# Patient Record
Sex: Male | Born: 1995 | Race: White | Hispanic: No | Marital: Single | Smoking: Never smoker
Health system: Southern US, Community
[De-identification: ages and names within clinical notes are randomized; demographics above are authoritative.]

## PROBLEM LIST (undated history)

## (undated) DIAGNOSIS — S42009A Fracture of unspecified part of unspecified clavicle, initial encounter for closed fracture: Secondary | ICD-10-CM

## (undated) DIAGNOSIS — M549 Dorsalgia, unspecified: Secondary | ICD-10-CM

---
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
## 2015-05-04 ENCOUNTER — Ambulatory Visit: Payer: Self-pay | Admitting: Primary Care

## 2015-07-15 ENCOUNTER — Other Ambulatory Visit: Payer: Self-pay | Admitting: Physical Medicine and Rehabilitation

## 2015-07-15 DIAGNOSIS — M545 Low back pain: Secondary | ICD-10-CM

## 2015-07-26 ENCOUNTER — Other Ambulatory Visit: Payer: Self-pay | Admitting: Physical Medicine and Rehabilitation

## 2015-07-26 DIAGNOSIS — M545 Low back pain: Secondary | ICD-10-CM

## 2015-07-27 ENCOUNTER — Ambulatory Visit
Admission: RE | Admit: 2015-07-27 | Discharge: 2015-07-27 | Disposition: A | Discharge status: Home / Self Care | Payer: BLUE CROSS/BLUE SHIELD | Source: Ambulatory Visit | Attending: Physical Medicine and Rehabilitation | Admitting: Physical Medicine and Rehabilitation

## 2015-07-27 DIAGNOSIS — M545 Low back pain: Secondary | ICD-10-CM

## 2015-08-28 ENCOUNTER — Encounter: Payer: Self-pay | Admitting: Emergency Medicine

## 2015-08-28 ENCOUNTER — Ambulatory Visit
Admission: EM | Admit: 2015-08-28 | Discharge: 2015-08-28 | Disposition: A | Discharge status: Home / Self Care | Payer: BLUE CROSS/BLUE SHIELD | Attending: Internal Medicine | Admitting: Internal Medicine

## 2015-08-28 DIAGNOSIS — H538 Other visual disturbances: Secondary | ICD-10-CM | POA: Diagnosis not present

## 2015-08-28 DIAGNOSIS — R0981 Nasal congestion: Secondary | ICD-10-CM

## 2015-08-28 HISTORY — DX: Dorsalgia, unspecified: M54.9

## 2015-08-28 HISTORY — DX: Fracture of unspecified part of unspecified clavicle, initial encounter for closed fracture: S42.009A

## 2015-08-28 MED ORDER — HYPROMELLOSE (GONIOSCOPIC) 2.5 % OP SOLN: 2.0000 [drp] | Freq: Four times a day (QID) | OPHTHALMIC | Status: AC | PRN

## 2015-08-28 MED ORDER — FLUORESCEIN SODIUM 1 MG OP STRP
1.0000 | ORAL_STRIP | Freq: Once | OPHTHALMIC | Status: AC
  Administered 2015-08-28: 1 via OPHTHALMIC

## 2015-08-28 MED ORDER — TETRACAINE HCL 0.5 % OP SOLN
1.0000 [drp] | Freq: Once | OPHTHALMIC | Status: AC
  Administered 2015-08-28: 1 [drp] via OPHTHALMIC

## 2015-08-28 NOTE — ED Notes (Signed)
Back problems for 6 months, getting steroid shots, herniated disc, last shot last Monday. Prescribed topiramate.  Last night a little blurred vision and SOB, and now vision so blurred can't read or drive.  Concerned reaction to his medications.

## 2015-08-28 NOTE — ED Provider Notes (Addendum)
CSN: [id number]     Arrival date & time 08/28/15  [id number] History   None    Chief Complaint  Patient presents with  . Blurred Vision  . Shortness of Breath   HPI  Patient is a 19 year old gentleman has been having some difficulty with back pain. He started on Topamax 25 mg daily on November 9. Last night, he took 50 mg, as a dose escalation. He had a little bit of blurry vision bilaterally last evening, this morning is having a lot of burning eye discomfort, wateriness, and a marked increase in blurring vision. Slightly short of breath, but not coughing, no runny nose, not aware of head congestion. No fever. Neurologically intact, was able to walk into the urgent care independently, moving all extremities, making sense when he speaks, coherent clear speech. 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 Has a follow-up with his back doctor Dr. Barbette MerinoShawn Bethea, on Monday, November 21.  Past Medical History  Diagnosis Date  . Back pain   . Collar bone fracture     Right side x2   History reviewed. No pertinent past surgical history. Family History  Problem Relation Age of Onset  . Diabetes Paternal Grandmother   . Depression Paternal Grandfather   . Hypertension Paternal Grandfather   . Heart attack Paternal Grandfather    Social History  Substance Use Topics  . Smoking status: Never Smoker   . Smokeless tobacco: None  . Alcohol Use: No    Review of Systems  All other systems reviewed and are negative.   Allergies  Review of patient's allergies indicates no known allergies.  Home Medications  Just started taking topamax for back pain   BP 145/82 mmHg  Pulse 87  Temp(Src) 98.1 F (36.7 C) (Oral)  Resp 18  Ht 5\' 7"  (1.702 m)  Wt 151 lb (68.493 kg)  BMI 23.64 kg/m2  SpO2 100%   Physical Exam  Constitutional: He is oriented to person, place, and time. No distress.  Alert, nicely groomed  HENT:  Head:  Atraumatic.  HEENT notes inadvertently documented in the eye section  Eyes:  Conjugate gaze, makes eye contact. Bilateral upper and lower lids are slightly swollen, slightly erythematous. Conjunctiva bilaterally is mildly injected. Pupils are equal and round, react somewhat sluggishly to light, but they do react and are symmetric. Photophobia. Mild chemosis of the lower conjunctivae bilaterally. Tearing observed, and some scant mucousy discharge in both eyes. Bilateral TMs are moderately dull, no erythema. Moderate nasal congestion on the left, marked nasal congestion on the right. Posterior pharynx is red.  Neck: Neck supple.  Cardiovascular: Normal rate and regular rhythm.   Pulmonary/Chest: No respiratory distress. He has no wheezes. He has no rales.  Lungs clear, symmetric breath sounds Good chest excursion, no respiratory distress at the time of this exam, quiet unlabored respirations  Abdominal: Soft. He exhibits no distension.  Musculoskeletal: Normal range of motion.  Neurological: He is alert and oriented to person, place, and time.  Walked into the urgent care independently, able to climb onto the exam table, face is symmetric, speech is clear/coherent  Skin: Skin is warm and dry.  No cyanosis Pink  Nursing note and vitals reviewed.   ED Course  Procedures    Visual Acuity Review  Right Eye Distance:  (unable to identify letter or picture, just a blurr) Left Eye Distance:  (unable to  identify letter or picture, just blurr ) Bilateral Distance:  (not able to see letter or picture, just blurr; usual vision 20/20 no glasses )  Right Eye Near:   Left Eye Near:    Bilateral Near:   (can read large type on admission form if about 6 inches from face )      MDM   1. Blurred vision, bilateral   2. Sinus congestion    Possible medication side effect, ingestion is also on the differential diagnosis. Impending upper respiratory infection with mucousy discharge in the eyes  is another possibility. Cool compresses may be soothing. Artificial tears may also be helpful. Patient is to discontinue Topamax, and follow-up with his back doctor to discuss other options. This is currently scheduled for Monday 11/21. Anticipate improvement in vision in the next 24-48 hours; if this does not occur patient should see the eye doctor. Note for work, with return to work of 11/21, if needed.  Eustace Moore, MD 08/28/15 1010  Eustace Moore, MD 08/29/15 2017

## 2015-08-28 NOTE — Discharge Instructions (Signed)
Blurred vision today may be coming from a medication reaction (topiramate?) or  possibly sinus discharge/early sinus infection.   Stop taking topamax.   Have eyes checked by eye doctor if not starting to improve in next 24-48 hours.   Cool compresses may help with eye discomfort.  Blurred Vision Having blurred vision means that you cannot see things clearly. Your vision may seem fuzzy or out of focus. Blurred vision is a very common symptom of an eye or vision problem. Blurred vision is often a gradual blur that occurs in one eye or both eyes. There are many causes of blurred vision, including cataracts, macular degeneration, and diabetic retinopathy. Blurred vision can be diagnosed based on your symptoms and a physical exam. Tell your health care provider about any other health problems you have, any recent eye injury, and any prior surgeries. You may need to see a health care provider who specializes in eye problems (ophthalmologist). Your treatment depends on what is causing your blurred vision.  HOME CARE INSTRUCTIONS  Tell your health care provider about any changes in your blurred vision.  Do not drive or operate heavy machinery if your vision is blurry.  Keep all follow-up visits as directed by your health care provider. This is important. SEEK MEDICAL CARE IF:  Your symptoms get worse.  You have new symptoms.  You have trouble seeing at night.  You have trouble seeing up close or far away.  You have trouble noticing the difference between colors. SEEK IMMEDIATE MEDICAL CARE IF:  You have severe eye pain.  You have a severe headache.  You have flashing lights in your field of vision.  You have a sudden change in vision.  You have a sudden loss of vision.  You have vision change after an injury.  You notice drainage coming from your eyes.  You notice a rash around your eyes.   This information is not intended to replace advice given to you by your health care  provider. Make sure you discuss any questions you have with your health care provider.   Document Released: 09/28/2003 Document Revised: 02/09/2015 Document Reviewed: 08/19/2014 Elsevier Interactive Patient Education [employer]2016 [employer]vier Inc.

## 2015-10-15 ENCOUNTER — Encounter: Payer: Self-pay | Admitting: Emergency Medicine

## 2015-10-15 ENCOUNTER — Emergency Department
Admission: EM | Admit: 2015-10-15 | Discharge: 2015-10-15 | Disposition: A | Discharge status: Home / Self Care | Payer: BLUE CROSS/BLUE SHIELD | Attending: Student | Admitting: Student

## 2015-10-15 ENCOUNTER — Emergency Department: Payer: BLUE CROSS/BLUE SHIELD

## 2015-10-15 DIAGNOSIS — R109 Unspecified abdominal pain: Secondary | ICD-10-CM

## 2015-10-15 DIAGNOSIS — R000 Tachycardia, unspecified: Secondary | ICD-10-CM | POA: Insufficient documentation

## 2015-10-15 DIAGNOSIS — N200 Calculus of kidney: Secondary | ICD-10-CM | POA: Diagnosis not present

## 2015-10-15 DIAGNOSIS — R1031 Right lower quadrant pain: Secondary | ICD-10-CM | POA: Diagnosis present

## 2015-10-15 LAB — CBC WITH DIFFERENTIAL/PLATELET
BASOS ABS: 0.1 10*3/uL (ref 0–0.1)
Basophils Relative: 0 %
EOS ABS: 0.1 10*3/uL (ref 0–0.7)
Eosinophils Relative: 0 %
HCT: 45.3 % (ref 40.0–52.0)
HEMOGLOBIN: 15.3 g/dL (ref 13.0–18.0)
LYMPHS ABS: 1.4 10*3/uL (ref 1.0–3.6)
Lymphocytes Relative: 10 %
MCH: 31.3 pg (ref 26.0–34.0)
MCHC: 33.8 g/dL (ref 32.0–36.0)
MCV: 92.5 fL (ref 80.0–100.0)
Monocytes Absolute: 1 10*3/uL (ref 0.2–1.0)
Monocytes Relative: 7 %
NEUTROS PCT: 83 %
Neutro Abs: 12.4 10*3/uL — ABNORMAL HIGH (ref 1.4–6.5)
Platelets: 192 10*3/uL (ref 150–440)
RBC: 4.89 MIL/uL (ref 4.40–5.90)
RDW: 12.7 % (ref 11.5–14.5)
WBC: 14.9 10*3/uL — AB (ref 3.8–10.6)

## 2015-10-15 LAB — URINALYSIS COMPLETE WITH MICROSCOPIC (ARMC ONLY)
BACTERIA UA: NONE SEEN
Bilirubin Urine: NEGATIVE
Glucose, UA: NEGATIVE mg/dL
Hgb urine dipstick: NEGATIVE
Leukocytes, UA: NEGATIVE
Nitrite: NEGATIVE
PROTEIN: 30 mg/dL — AB
RBC / HPF: NONE SEEN RBC/hpf (ref 0–5)
Specific Gravity, Urine: 1.028 (ref 1.005–1.030)
pH: 5 (ref 5.0–8.0)

## 2015-10-15 LAB — COMPREHENSIVE METABOLIC PANEL
ALK PHOS: 60 U/L (ref 38–126)
ALT: 18 U/L (ref 17–63)
AST: 20 U/L (ref 15–41)
Albumin: 5.3 g/dL — ABNORMAL HIGH (ref 3.5–5.0)
Anion gap: 10 (ref 5–15)
BILIRUBIN TOTAL: 1 mg/dL (ref 0.3–1.2)
BUN: 18 mg/dL (ref 6–20)
CALCIUM: 10 mg/dL (ref 8.9–10.3)
CO2: 26 mmol/L (ref 22–32)
CREATININE: 0.89 mg/dL (ref 0.61–1.24)
Chloride: 104 mmol/L (ref 101–111)
GFR calc non Af Amer: >60 mL/min (ref >60)
Glucose, Bld: 132 mg/dL — ABNORMAL HIGH (ref 65–99)
Potassium: 4.2 mmol/L (ref 3.5–5.1)
SODIUM: 140 mmol/L (ref 135–145)
Total Protein: 7.9 g/dL (ref 6.5–8.1)

## 2015-10-15 LAB — LIPASE, BLOOD: Lipase: 22 U/L (ref 11–51)

## 2015-10-15 MED ORDER — HYDROMORPHONE HCL 1 MG/ML IJ SOLN
1.0000 mg | Freq: Once | INTRAMUSCULAR | Status: AC
  Administered 2015-10-15: 1 mg via INTRAVENOUS
  Filled 2015-10-15: qty 1

## 2015-10-15 MED ORDER — KETOROLAC TROMETHAMINE 30 MG/ML IJ SOLN
INTRAMUSCULAR | Status: AC
  Administered 2015-10-15: 30 mg via INTRAVENOUS
  Filled 2015-10-15: qty 1

## 2015-10-15 MED ORDER — SODIUM CHLORIDE 0.9 % IV BOLUS (SEPSIS)
1000.0000 mL | Freq: Once | INTRAVENOUS | Status: AC
  Administered 2015-10-15: 1000 mL via INTRAVENOUS

## 2015-10-15 MED ORDER — OXYCODONE HCL 5 MG PO TABS: 5.0000 mg | ORAL_TABLET | Freq: Four times a day (QID) | ORAL | Status: AC | PRN

## 2015-10-15 MED ORDER — TAMSULOSIN HCL 0.4 MG PO CAPS: 0.4000 mg | ORAL_CAPSULE | Freq: Every day | ORAL | Status: AC

## 2015-10-15 MED ORDER — KETOROLAC TROMETHAMINE 30 MG/ML IJ SOLN
30.0000 mg | Freq: Once | INTRAMUSCULAR | Status: AC
Start: 2015-10-15 — End: 2015-10-15
  Administered 2015-10-15: 30 mg via INTRAVENOUS

## 2015-10-15 MED ORDER — ONDANSETRON 4 MG PO TBDP: 4.0000 mg | ORAL_TABLET | Freq: Three times a day (TID) | ORAL | Status: AC | PRN

## 2015-10-15 MED ORDER — ONDANSETRON HCL 4 MG/2ML IJ SOLN
4.0000 mg | Freq: Once | INTRAMUSCULAR | Status: AC
  Administered 2015-10-15: 4 mg via INTRAVENOUS
  Filled 2015-10-15: qty 2

## 2015-10-15 MED ORDER — MORPHINE SULFATE (PF) 4 MG/ML IV SOLN
4.0000 mg | Freq: Once | INTRAVENOUS | Status: AC
  Administered 2015-10-15: 4 mg via INTRAVENOUS
  Filled 2015-10-15: qty 1

## 2015-10-15 NOTE — ED Notes (Signed)
AAOx3.  Skin warm and dry. NAD.  Ambulates with easy and steady gait.   

## 2015-10-15 NOTE — ED Notes (Signed)
States he developed right flank and abd pain this am   Pos n/v

## 2015-10-15 NOTE — ED Provider Notes (Signed)
[facility] Emergency Department Provider Note  ____________________________________________  Time seen: Approximately 10:57 AM  I have reviewed the triage vital signs and the nursing notes.   HISTORY  Chief Complaint Flank Pain    HPI Hulan Saasustin Lukin is a 20 y.o. male with history of chronic back pain who presents for evaluation of sudden onset right flank pain radiating into the right lower abdomen and into the suprapubic region that began this morning, constant since onset, severe, no modifying factors. He is also had 2 episodes of nonbloody nonbilious emesis. No diarrhea but did have a bowel movement this morning. He denies any pain or burning with urination or hematuria. No chest pain or difficulty breathing. No History of kidney stones.   Past Medical History  Diagnosis Date  . Back pain   . Collar bone fracture     Right side x2    Patient Active Problem List   Diagnosis Date Noted  . Hematemesis 10/24/2011  . Epigastric abdominal pain 10/24/2011    History reviewed. No pertinent past surgical history.  Current Outpatient Rx  Name  Route  Sig  Dispense  Refill  . ibuprofen (ADVIL,MOTRIN) 200 MG tablet   Oral   Take 400 mg by mouth every 6 (six) hours as needed.          . hydroxypropyl methylcellulose / hypromellose (ISOPTO TEARS / GONIOVISC) 2.5 % ophthalmic solution   Both Eyes   Place 2 drops into both eyes 4 (four) times daily as needed for dry eyes. Patient not taking: Reported on 10/15/2015   15 mL   0     Allergies Topamax  Family History  Problem Relation Age of Onset  . Diabetes Paternal Grandmother   . Depression Paternal Grandfather   . Hypertension Paternal Grandfather   . Heart attack Paternal Grandfather     Social History Social History  Substance Use Topics  . Smoking status: Never Smoker   . Smokeless tobacco: None  . Alcohol Use: No    Review of Systems Constitutional: No fever/chills Eyes: No visual  changes. ENT: No sore throat. Cardiovascular: Denies chest pain. Respiratory: Denies shortness of breath. Gastrointestinal: + abdominal pain.  + nausea, + vomiting.  No diarrhea.  No constipation. Genitourinary: Negative for dysuria. Musculoskeletal: Positive for right flank pain. Skin: Negative for rash. Neurological: Negative for headaches, focal weakness or numbness.  10-point ROS otherwise negative.  ____________________________________________   PHYSICAL EXAM:  VITAL SIGNS: ED Triage Vitals  Enc Vitals Group     BP 10/15/15 0958 134/108 mmHg     Pulse Rate 10/15/15 0958 118     Resp 10/15/15 0958 20     Temp 10/15/15 0958 97.5 F (36.4 C)     Temp src --      SpO2 10/15/15 0958 98 %     Weight 10/15/15 0958 150 lb (68.04 kg)     Height 10/15/15 0958 5\' 7"  (1.702 m)     Head Cir --      Peak Flow --      Pain Score 10/15/15 0958 10     Pain Loc --      Pain Edu? --      Excl. in GC? --     Constitutional: Alert and oriented. In distress due to pain. Mildly diaphoretic. Eyes: Conjunctivae are normal. PERRL. EOMI. Head: Atraumatic. Nose: No congestion/rhinnorhea. Mouth/Throat: Mucous membranes are moist.  Oropharynx non-erythematous. Neck: No stridor.  Cardiovascular: Tachycardic rate, regular rhythm. Grossly normal heart  sounds.  Good peripheral circulation. Respiratory: Normal respiratory effort.  No retractions. Lungs CTAB. Gastrointestinal: Soft mild suprapubic and right-sided tenderness, no rebound or guarding. + right CVA tenderness. Genitourinary: Deferred Musculoskeletal: No lower extremity tenderness nor edema.  No joint effusions. Neurologic:  Normal speech and language. No gross focal neurologic deficits are appreciated.  Skin:  Skin is warm, dry and intact. No rash noted. Psychiatric: Mood and affect are normal. Speech and behavior are normal.  ____________________________________________   LABS (all labs ordered are listed, but only abnormal  results are displayed)  Labs Reviewed  CBC WITH DIFFERENTIAL/PLATELET - Abnormal; Notable for the following:    WBC 14.9 (*)    Neutro Abs 12.4 (*)    All other components within normal limits  COMPREHENSIVE METABOLIC PANEL - Abnormal; Notable for the following:    Glucose, Bld 132 (*)    Albumin 5.3 (*)    All other components within normal limits  URINALYSIS COMPLETEWITH MICROSCOPIC ([facility] ONLY) - Abnormal; Notable for the following:    Color, Urine YELLOW (*)    APPearance HAZY (*)    Ketones, ur 1+ (*)    Protein, ur 30 (*)    Squamous Epithelial / LPF 0-5 (*)    All other components within normal limits  LIPASE, BLOOD   ____________________________________________  EKG  none ____________________________________________  RADIOLOGY  CT abdomen and pelvis  IMPRESSION: There is a 4 mm stone within the distal right ureter resulting in mild right hydroureteronephrosis.  Calcifications within the renal medulla bilaterally raising the possibility of medullary nephrocalcinosis.  ____________________________________________   PROCEDURES  Procedure(s) performed: None  Critical Care performed: No  ____________________________________________   INITIAL IMPRESSION / ASSESSMENT AND PLAN / ED COURSE  Pertinent labs & imaging results that were available during my care of the patient were reviewed by me and considered in my medical decision making (see chart for details).  Berel Najjar is a 20 y.o. male with history of chronic back pain who presents for evaluation of sudden onset right flank pain radiating into the right lower abdomen and into the suprapubic region that began this morning. On exam, he is in distress due to pain. Vital signs notable for tachycardia, he is afebrile with stable blood pressure. He does have right CVA tenderness and mouth right-sided abdominal tenderness. Clinical picture is concerning for nephrolithiasis/renal colic. Awaiting labs, urinalysis  as well as CT of the abdomen and pelvis. We'll treat his pain, give IV fluids, reassess for disposition.  ----------------------------------------- 1:31 PM on 10/15/2015 -----------------------------------------  Labs reviewed. It is notable for leukocytosis of 14.9. Normal CMP. Normal lipase. Urinalysis negative for infection. CT of the abdomen and pelvis shows a 4 mm right distal ureteral stone with mild right hydroureteronephrosis. At this time, the patient's pain is well-controlled. He is tolerating by mouth intake. He was slightly bradycardic however heart rate is now 71 bpm. He is maintaining adequate blood pressure. We discussed return precautions, need for close urology follow-up, he is comfortable with the discharge plan. DC with expectant management. ____________________________________________   FINAL CLINICAL IMPRESSION(S) / ED DIAGNOSES  Final diagnoses:  Flank pain  Right nephrolithiasis      Gayla Doss, MD 10/15/15 1332

## 2017-08-09 IMAGING — MR MR LUMBAR SPINE W/O CM
5 series · 43 of 48 positions shown · non-contrast
Comparison: Lumbar spine plain films are reported separately.

CLINICAL DATA: No known injury. Back pain for 6-7 months. RIGHT
groin pain. Unresponsive to steroids.

EXAM:
MRI LUMBAR SPINE WITHOUT CONTRAST
TECHNIQUE: Multiplanar, multisequence MR imaging of the lumbar spine was
performed. No intravenous contrast was administered.

[Series 3: tirm sag · sagittal · 4.0mm · 0.55mm/px · 7 of 14 slices shown]
[im 1/14]
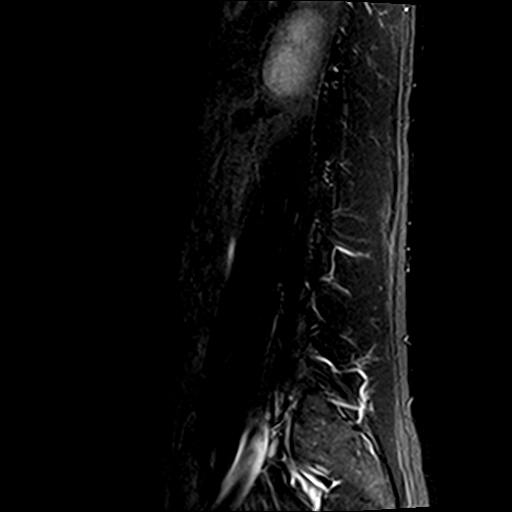
[im 3/14]
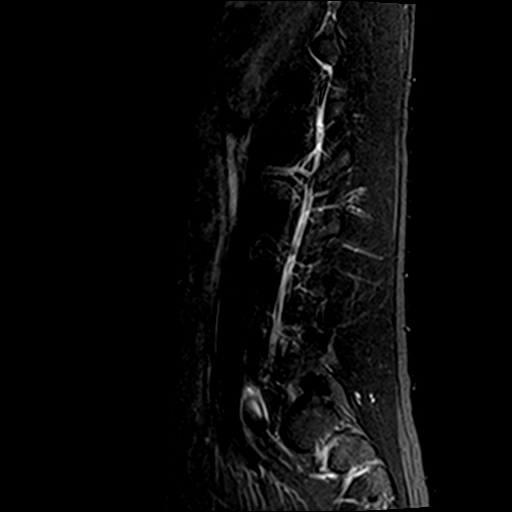
[im 5/14]
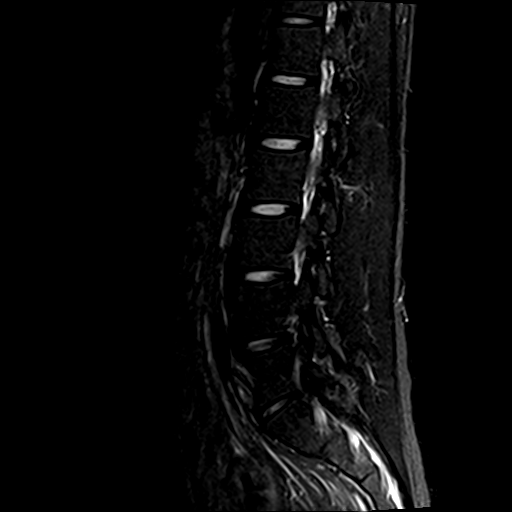
[im 7/14]
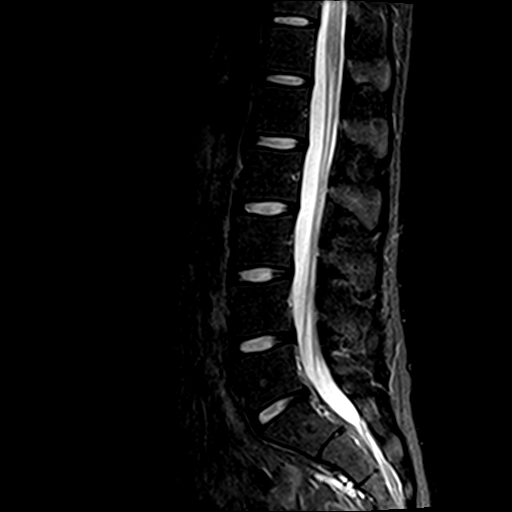
[im 9/14]
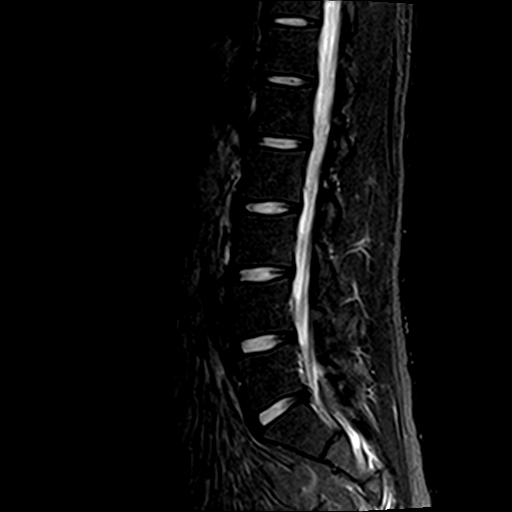
[im 11/14]
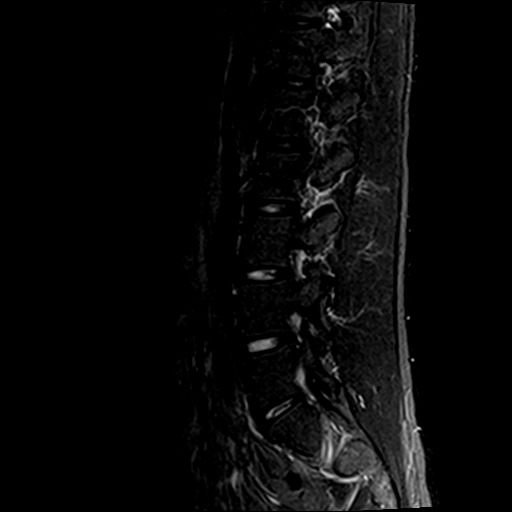
[im 14/14]
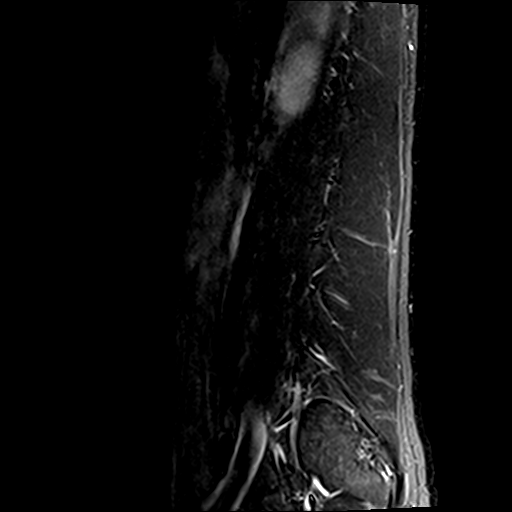

[Series 4: T2 · sagittal · 4.0mm · 0.88mm/px · 7 of 14 slices shown (1 of 2)]
[im 1/14]
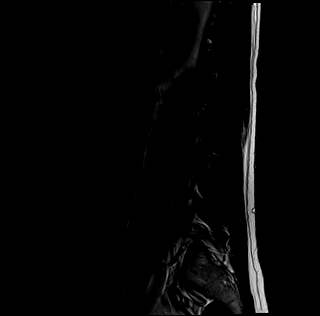
[im 3/14]
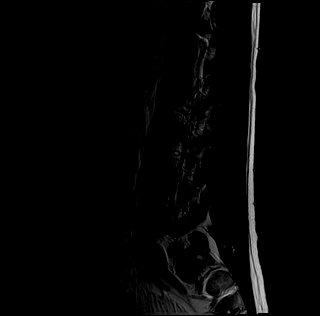
[im 5/14]
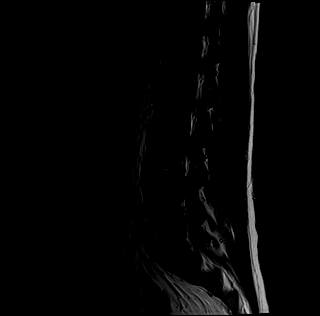
[im 7/14]
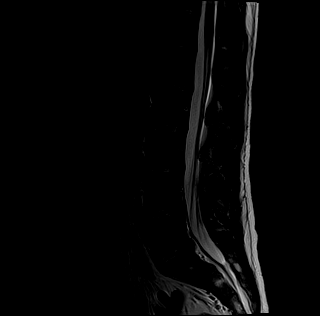
[im 9/14]
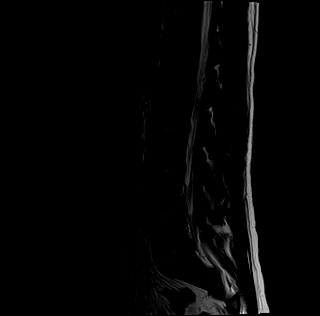
[im 11/14]
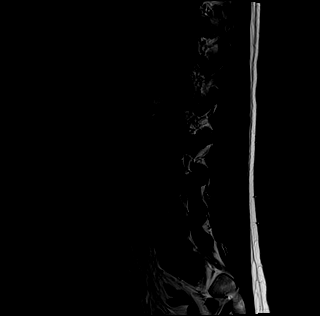
[im 14/14]
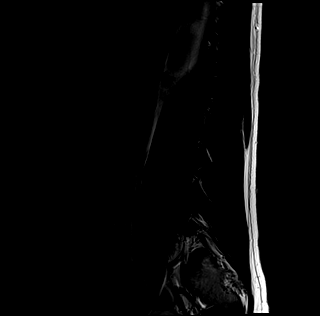

[Series 5: T1 · sagittal · 4.0mm · 0.88mm/px · 6 of 14 slices shown (1 of 2)]
[im 1/14]
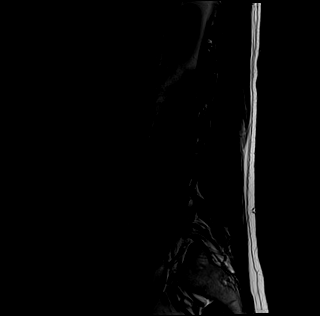
[im 3/14]
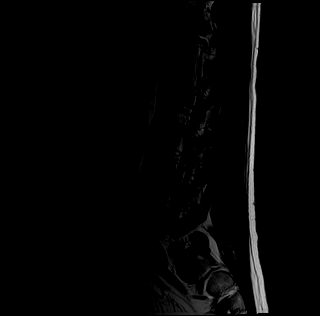
[im 6/14]
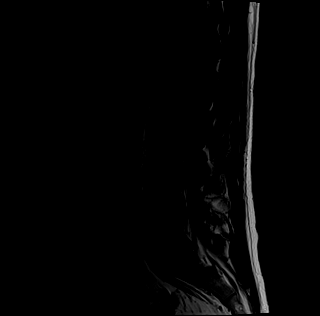
[im 8/14]
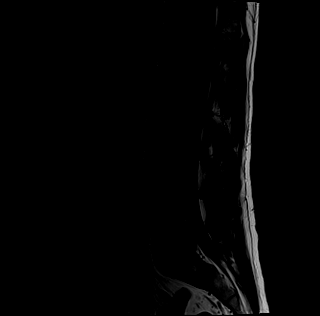
[im 11/14]
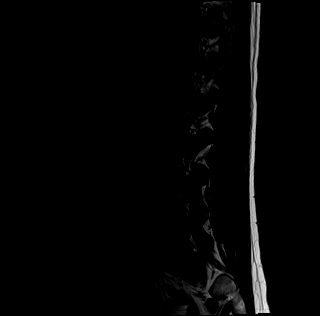
[im 14/14]
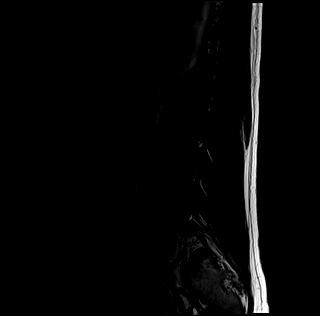

[Series 6: T1 · axial · 4.0mm · 0.70mm/px · z∈[-53,+132]mm · 9 of 32 slices shown (2 of 2)]
[im 1/32]
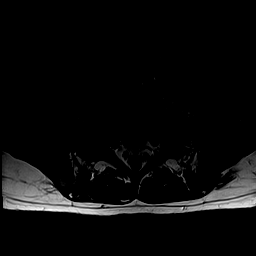
[im 3/32]
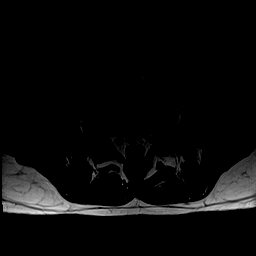
[im 5/32]
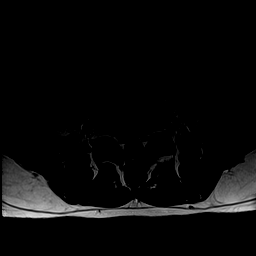
[im 10/32]
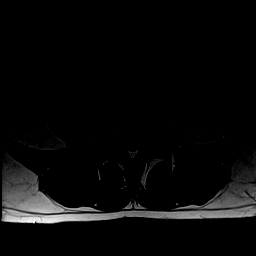
[im 15/32]
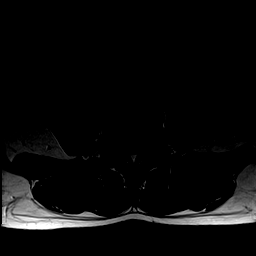
[im 17/32]
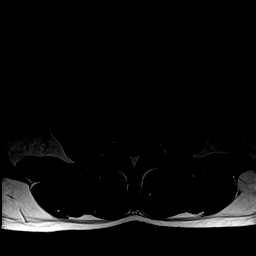
[im 22/32]
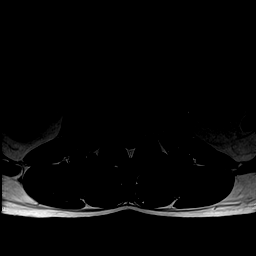
[im 27/32]
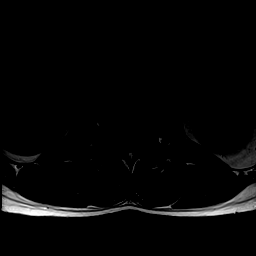
[im 32/32]
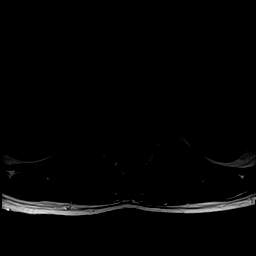

[Series 7: T2 · axial · 4.0mm · 0.70mm/px · z∈[-53,+132]mm · 14 of 32 slices shown (2 of 2)]
[im 1/32]
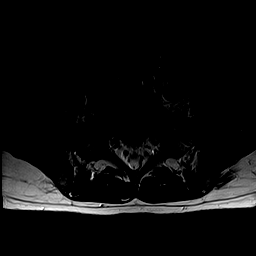
[im 3/32]
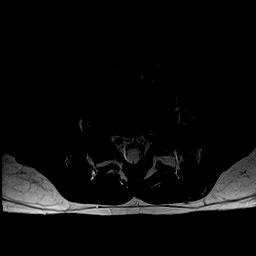
[im 5/32]
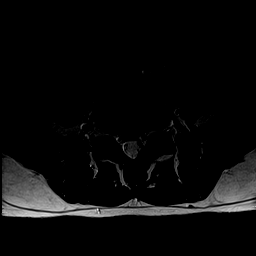
[im 8/32]
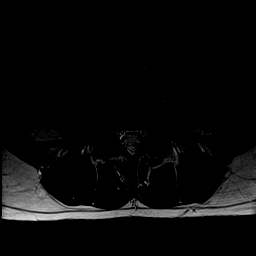
[im 10/32]
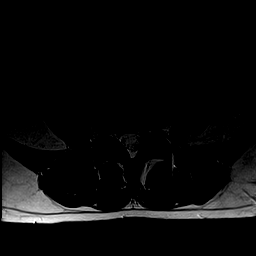
[im 12/32]
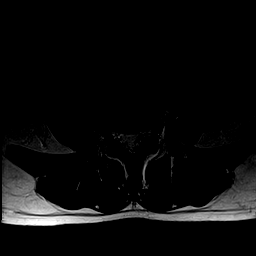
[im 15/32]
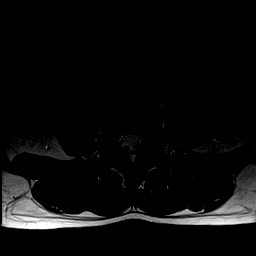
[im 17/32]
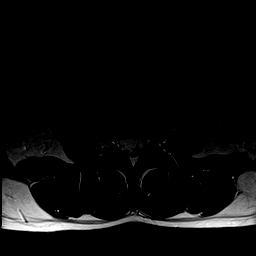
[im 20/32]
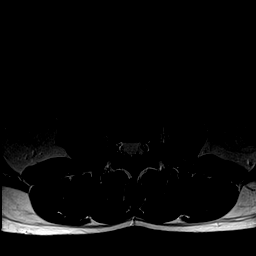
[im 22/32]
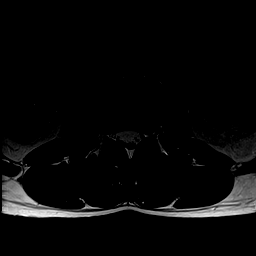
[im 24/32]
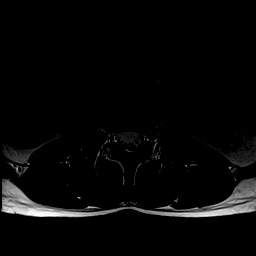
[im 27/32]
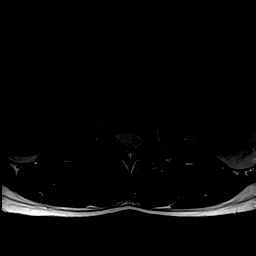
[im 29/32]
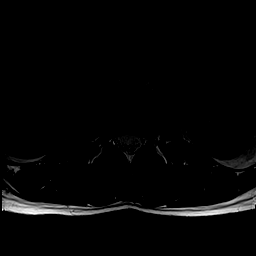
[im 32/32]
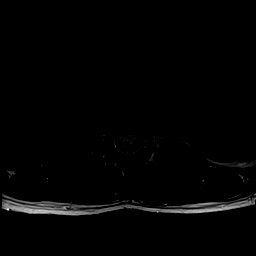

[43 of 48 positions shown; findings below may reference images not displayed]

FINDINGS: Segmentation: Normal.

Alignment:  Normal.

Vertebrae: No worrisome osseous lesion.

Conus medullaris: Normal in size, signal, and location.

Paraspinal tissues: No evidence for hydronephrosis or paravertebral
mass.

Disc levels:

L1-L2:  Normal.

L2-L3:  Normal.

L3-L4:  Normal.

L4-L5:  Normal.

L5-S1: Central and rightward protrusion. This contacts, and slightly
displaces, the RIGHT S1 nerve root. No significant foraminal
narrowing. No significant spinal stenosis.
IMPRESSION: Central and rightward protrusion L5-S1. Mild mass effect on the
RIGHT S1 nerve root.

## 2024-02-28 ENCOUNTER — Ambulatory Visit
Admission: RE | Admit: 2024-02-28 | Discharge: 2024-02-28 | Disposition: A | Discharge status: Home / Self Care | Source: Ambulatory Visit | Attending: Family Medicine | Admitting: Family Medicine

## 2024-02-28 VITALS — BP 144/90 | HR 74 | Temp 99.1°F | Resp 18

## 2024-02-28 DIAGNOSIS — K920 Hematemesis: Secondary | ICD-10-CM | POA: Diagnosis not present

## 2024-02-28 DIAGNOSIS — K921 Melena: Secondary | ICD-10-CM

## 2024-02-28 DIAGNOSIS — F1021 Alcohol dependence, in remission: Secondary | ICD-10-CM | POA: Diagnosis not present

## 2024-02-28 NOTE — ED Triage Notes (Signed)
 Pt reports blood in stool today, Monday vomited up blood.

## 2024-02-28 NOTE — Discharge Instructions (Addendum)
 It is important to establish care as soon as possible with a primary care provider to help coordinate your care and follow-up routinely with you.  You may go to the [facility] health website and click on the find a provider tab.  You may then find a primary care provider and set up an establish care appointment on the website.  I have placed a referral in case this goes through but it may likely need to go through a primary care provider to be excepted.  As we discussed at length, go to the emergency department for any worsening symptoms at any time.

## 2024-02-28 NOTE — ED Provider Notes (Addendum)
 [facility]    CSN: [id number] Arrival date & time: 02/28/24  7829      History   Chief Complaint Chief Complaint  Patient presents with   Abdominal Pain    Have puked up blood Monday and this morning had alot of blood in stool - Entered by patient    HPI Lawrence Gillespie is a 28 y.o. male.   Patient presenting today complaining of several episodes of hematemesis of bright red blood occurring 4 days ago, and then 3 episodes of bright red blood with bowel movements occurring this morning.  Denies abdominal pain though from time to time has some mild aching in the right upper quadrant and denies fever, chills, dizziness, weakness, chest pain, shortness of breath, new foods or medications.  Per chart review has had hematemesis in the past about 10 years ago.  He states he has been a heavy drinker for about 10 years now, drinking about 7 to 10 cans of twisted tea nightly but stopped Monday.  He also obtained his own metabolic panel at [facility] labs about a month ago and notes that his AST and ALT were about double the normal limit.  Does not currently have a primary care provider.  Called a GI specialist and was told he needed a referral and they directed him to urgent care.    Past Medical History:  Diagnosis Date   Back pain    Collar bone fracture    Right side x2    Patient Active Problem List   Diagnosis Date Noted   Hematemesis 10/24/2011   Epigastric abdominal pain 10/24/2011    History reviewed. No pertinent surgical history.     Home Medications    Prior to Admission medications   Medication Sig Start Date End Date Taking? Authorizing Provider  hydroxypropyl methylcellulose / hypromellose (ISOPTO TEARS / GONIOVISC) 2.5 % ophthalmic solution Place 2 drops into both eyes 4 (four) times daily as needed for dry eyes. Patient not taking: Reported on 10/15/2015 08/28/15   Abbey Hobby, MD  ibuprofen (ADVIL,MOTRIN) 200 MG tablet Take 400 mg by mouth every 6  (six) hours as needed.     [provider]  ondansetron  (ZOFRAN  ODT) 4 MG disintegrating tablet Take 1 tablet (4 mg total) by mouth every 8 (eight) hours as needed for nausea or vomiting. 10/15/15   Mancil Seat, MD  oxyCODONE  (ROXICODONE ) 5 MG immediate release tablet Take 1 tablet (5 mg total) by mouth every 6 (six) hours as needed for moderate pain. Do not drive while taking this medication. 10/15/15   Mancil Seat, MD  tamsulosin  (FLOMAX ) 0.4 MG CAPS capsule Take 1 capsule (0.4 mg total) by mouth daily. 10/15/15   Mancil Seat, MD  topiramate (TOPAMAX) 25 MG capsule Take 25 mg by mouth 2 (two) times daily.  08/28/15  [provider]    Family History Family History  Problem Relation Age of Onset   Diabetes Paternal Grandmother    Depression Paternal Grandfather    Hypertension Paternal Grandfather    Heart attack Paternal Grandfather     Social History Social History   Tobacco Use   Smoking status: Never  Substance Use Topics   Alcohol use: No   Drug use: No     Allergies   Topamax [topiramate]   Review of Systems Review of Systems Per HPI  Physical Exam Triage Vital Signs ED Triage Vitals  Encounter Vitals Group     BP 02/28/24 0933 Aaron Aas)  144/90     Systolic BP Percentile --      Diastolic BP Percentile --      Pulse Rate 02/28/24 0933 74     Resp 02/28/24 0933 18     Temp 02/28/24 0933 99.1 F (37.3 C)     Temp Source 02/28/24 0933 Oral     SpO2 02/28/24 0933 97 %     Weight --      Height --      Head Circumference --      Peak Flow --      Pain Score 02/28/24 0934 0     Pain Loc --      Pain Education --      Exclude from Growth Chart --    No data found.  Updated Vital Signs BP (!) 144/90 (BP Location: Right Arm)   Pulse 74   Temp 99.1 F (37.3 C) (Oral)   Resp 18   SpO2 97%   Visual Acuity Right Eye Distance:   Left Eye Distance:   Bilateral Distance:    Right Eye Near:   Left Eye Near:    Bilateral Near:      Physical Exam Vitals and nursing note reviewed.  Constitutional:      Appearance: Normal appearance.  HENT:     Head: Atraumatic.     Mouth/Throat:     Mouth: Mucous membranes are moist.     Pharynx: Oropharynx is clear.  Eyes:     Extraocular Movements: Extraocular movements intact.     Conjunctiva/sclera: Conjunctivae normal.  Cardiovascular:     Rate and Rhythm: Normal rate and regular rhythm.  Pulmonary:     Effort: Pulmonary effort is normal.     Breath sounds: Normal breath sounds.  Abdominal:     General: Bowel sounds are normal. There is no distension.     Palpations: Abdomen is soft.     Tenderness: There is no abdominal tenderness. There is no right CVA tenderness, left CVA tenderness or guarding.  Musculoskeletal:        General: Normal range of motion.     Cervical back: Normal range of motion and neck supple.  Skin:    General: Skin is warm and dry.  Neurological:     Mental Status: He is oriented to person, place, and time.  Psychiatric:        Mood and Affect: Mood normal.        Thought Content: Thought content normal.        Judgment: Judgment normal.      UC Treatments / Results  Labs (all labs ordered are listed, but only abnormal results are displayed) Labs Reviewed  COMPREHENSIVE METABOLIC PANEL WITH GFR  CBC WITH DIFFERENTIAL/PLATELET    EKG   Radiology No results found.  Procedures Procedures (including critical care time)  Medications Ordered in UC Medications - No data to display  Initial Impression / Assessment and Plan / UC Course  I have reviewed the triage vital signs and the nursing notes.  Pertinent labs & imaging results that were available during my care of the patient were reviewed by me and considered in my medical decision making (see chart for details).     Patient mildly hypertensive in triage, otherwise vital signs reassuring.  He is well-appearing and in no acute distress and no red flag findings on exam today.   CBC and CMP pending.  Did discuss at length signs to watch for with alcohol withdrawal as he  stopped drinking 4 days ago, he currently endorses none of the concerning features of withdrawal symptoms but knows to watch for these and to go to the emergency department if experiencing them.  Did also discuss if bleeding persist or worsens or symptoms worsen in any way to go to the emergency department rather than waiting for outpatient referral.  Supportive home care recommended.  Referral placed and information given for finding a primary care provider.  Final Clinical Impressions(s) / UC Diagnoses   Final diagnoses:  Hematemesis, unspecified whether nausea present  History of alcohol dependence (HCC)  Hematochezia     Discharge Instructions      It is important to establish care as soon as possible with a primary care provider to help coordinate your care and follow-up routinely with you.  You may go to the [facility] health website and click on the find a provider tab.  You may then find a primary care provider and set up an establish care appointment on the website.  I have placed a referral in case this goes through but it may likely need to go through a primary care provider to be excepted.  As we discussed at length, go to the emergency department for any worsening symptoms at any time.  ED Prescriptions   None    PDMP not reviewed this encounter.   Corbin Dess, [place] 02/28/24 1034    Tacy Expose Summer Set, [place] 02/28/24 1034

## 2024-02-29 LAB — CBC WITH DIFFERENTIAL/PLATELET
Basophils Absolute: 0.1 10*3/uL (ref 0.0–0.2)
Basos: 1 %
EOS (ABSOLUTE): 0.1 10*3/uL (ref 0.0–0.4)
Eos: 1 %
Hematocrit: 46.1 % (ref 37.5–51.0)
Hemoglobin: 15.6 g/dL (ref 13.0–17.7)
Immature Grans (Abs): 0 10*3/uL (ref 0.0–0.1)
Immature Granulocytes: 0 %
Lymphocytes Absolute: 2 10*3/uL (ref 0.7–3.1)
Lymphs: 26 %
MCH: 34.7 pg — ABNORMAL HIGH (ref 26.6–33.0)
MCHC: 33.8 g/dL (ref 31.5–35.7)
MCV: 102 fL — ABNORMAL HIGH (ref 79–97)
Monocytes Absolute: 0.7 10*3/uL (ref 0.1–0.9)
Monocytes: 9 %
Neutrophils Absolute: 4.8 10*3/uL (ref 1.4–7.0)
Neutrophils: 63 %
Platelets: 190 10*3/uL (ref 150–450)
RBC: 4.5 x10E6/uL (ref 4.14–5.80)
RDW: 12.8 % (ref 11.6–15.4)
WBC: 7.6 10*3/uL (ref 3.4–10.8)

## 2024-02-29 LAB — COMPREHENSIVE METABOLIC PANEL WITH GFR
ALT: 93 IU/L — ABNORMAL HIGH (ref 0–44)
AST: 57 IU/L — ABNORMAL HIGH (ref 0–40)
Albumin: 4.7 g/dL (ref 4.3–5.2)
Alkaline Phosphatase: 96 IU/L (ref 44–121)
BUN/Creatinine Ratio: 11 (ref 9–20)
BUN: 8 mg/dL (ref 6–20)
Bilirubin Total: 0.5 mg/dL (ref 0.0–1.2)
CO2: 21 mmol/L (ref 20–29)
Calcium: 9.8 mg/dL (ref 8.7–10.2)
Chloride: 102 mmol/L (ref 96–106)
Creatinine, Ser: 0.75 mg/dL — ABNORMAL LOW (ref 0.76–1.27)
Globulin, Total: 2.4 g/dL (ref 1.5–4.5)
Glucose: 82 mg/dL (ref 70–99)
Potassium: 4.2 mmol/L (ref 3.5–5.2)
Sodium: 141 mmol/L (ref 134–144)
Total Protein: 7.1 g/dL (ref 6.0–8.5)
eGFR: 126 mL/min/{1.73_m2} (ref >59)

## 2024-03-03 ENCOUNTER — Ambulatory Visit (HOSPITAL_COMMUNITY): Payer: Self-pay
